# Patient Record
Sex: Male | Born: 1970 | Race: Black or African American | Hispanic: No | Marital: Single | Smoking: Never smoker
Health system: Southern US, Community
[De-identification: ages and names within clinical notes are randomized; demographics above are authoritative.]

## PROBLEM LIST (undated history)

## (undated) DIAGNOSIS — M5431 Sciatica, right side: Secondary | ICD-10-CM

## (undated) DIAGNOSIS — M5430 Sciatica, unspecified side: Secondary | ICD-10-CM

---

## 1999-12-07 ENCOUNTER — Encounter: Admission: RE | Admit: 1999-12-07 | Discharge: 1999-12-07 | Payer: Self-pay | Admitting: Family Medicine

## 1999-12-07 ENCOUNTER — Encounter: Payer: Self-pay | Admitting: Family Medicine

## 2009-01-27 ENCOUNTER — Encounter: Admission: RE | Admit: 2009-01-27 | Discharge: 2009-01-27 | Payer: Self-pay | Admitting: Family Medicine

## 2009-03-10 ENCOUNTER — Encounter: Admission: RE | Admit: 2009-03-10 | Discharge: 2009-03-10 | Payer: Self-pay | Admitting: Orthopaedic Surgery

## 2010-03-07 IMAGING — CR DG LUMBAR SPINE COMPLETE 4+V
5 series · 5 of 5 positions shown · non-contrast
Comparison: None

CLINICAL DATA: Low back and right leg pain.  No specific trauma.

LUMBAR SPINE - COMPLETE 4+ VIEW

[t l-spine a.p.]
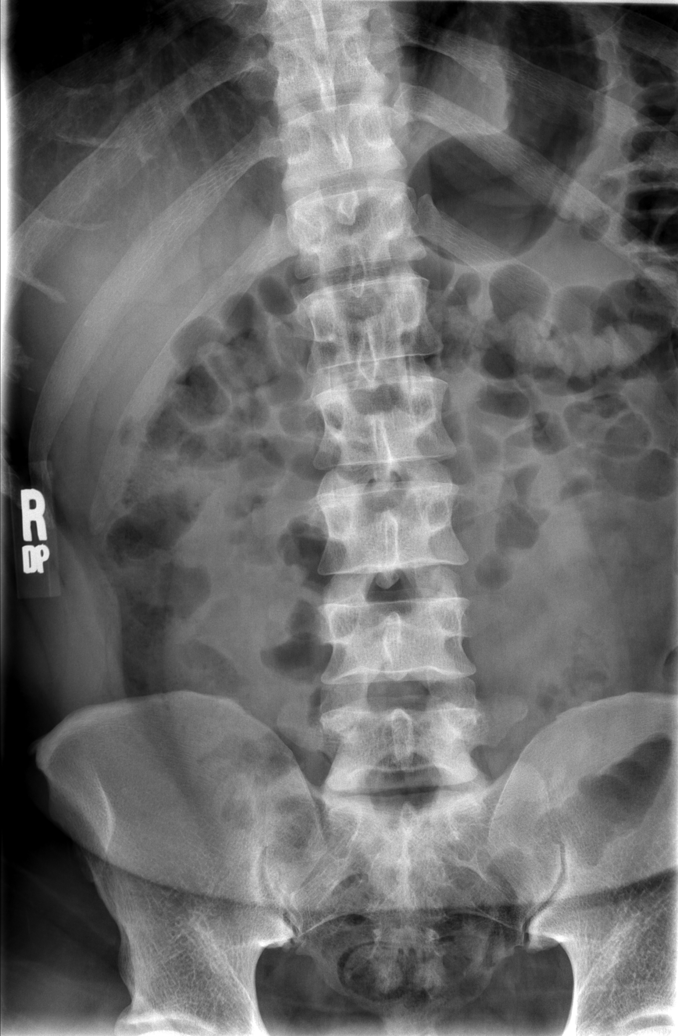

[t l-spine oblique exposure (1 of 2)]
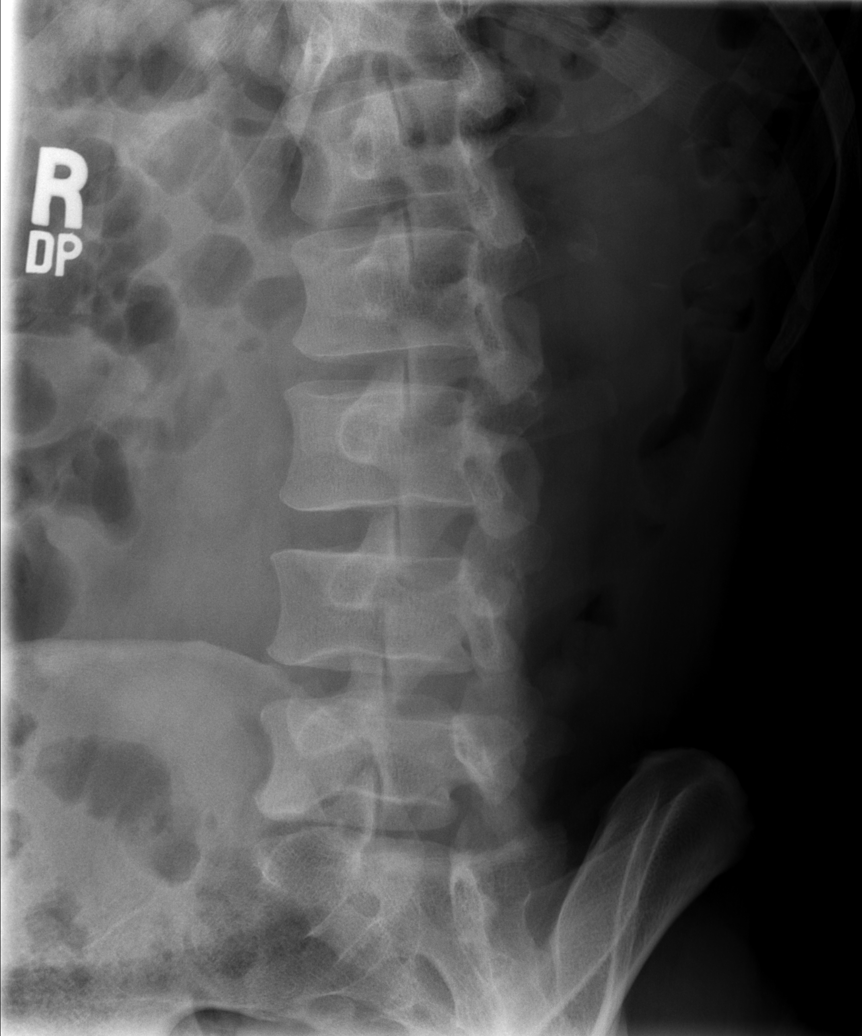

[t l-spine oblique exposure (2 of 2)]
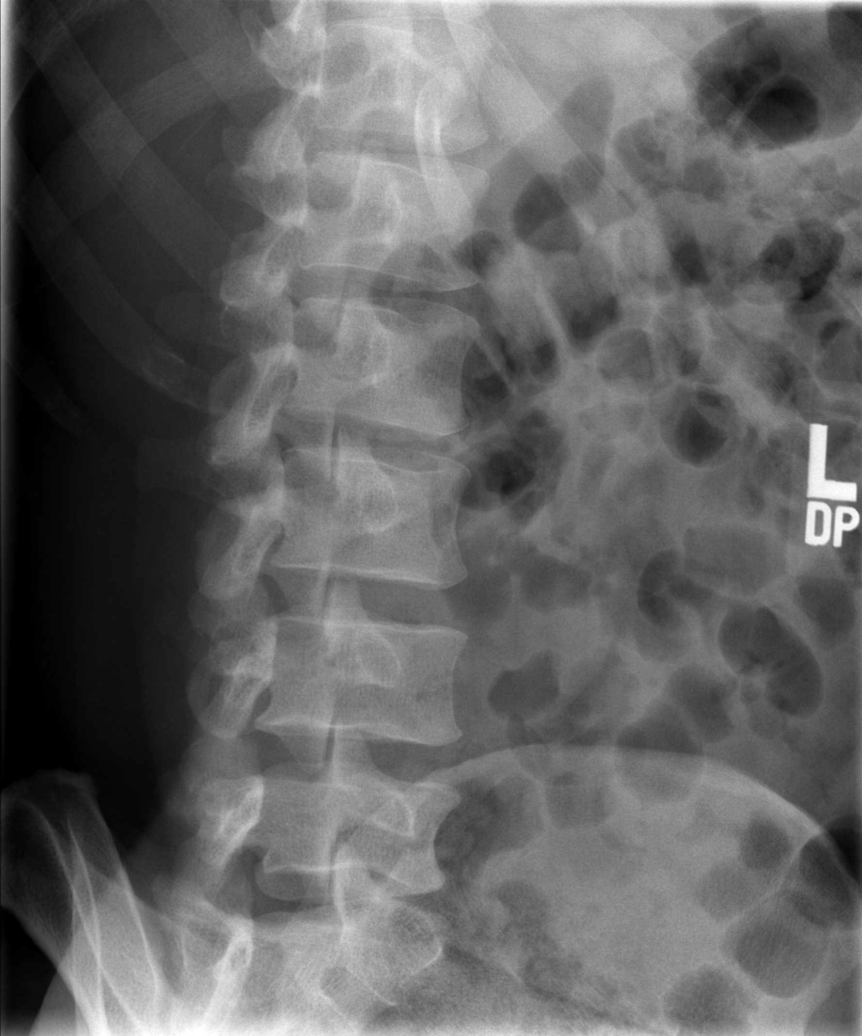

[t l-spine lat]
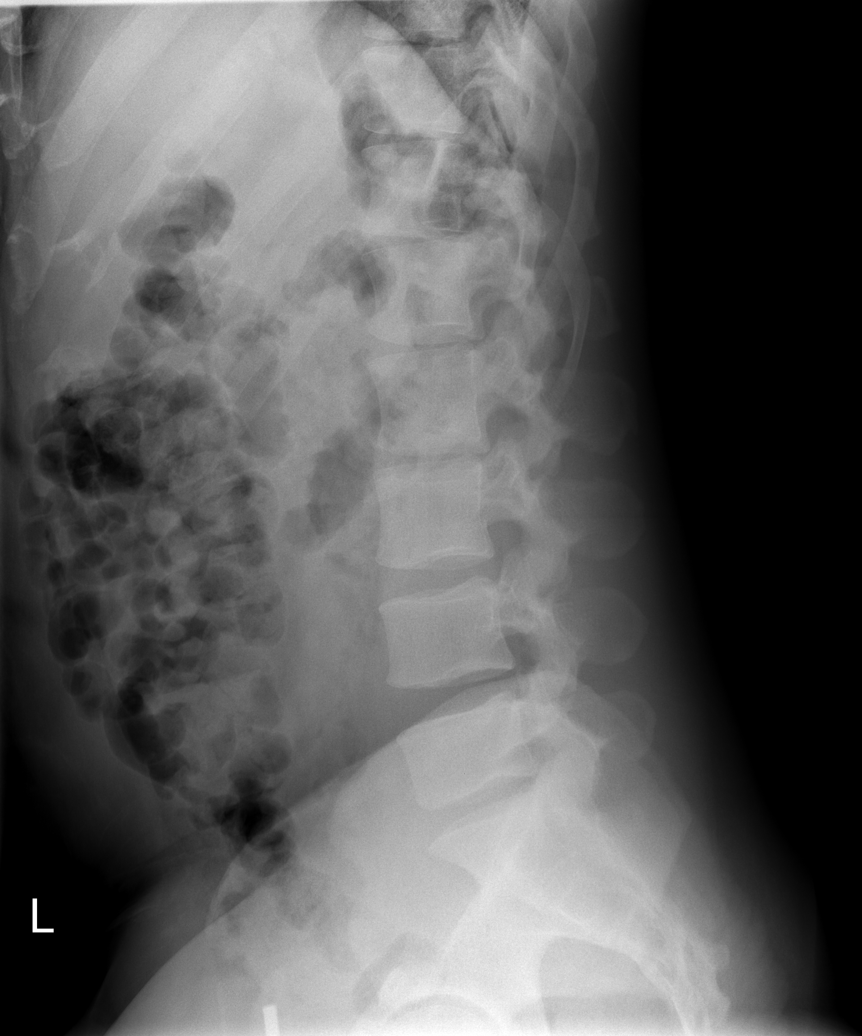

[t l-spine l5-s1 spot]
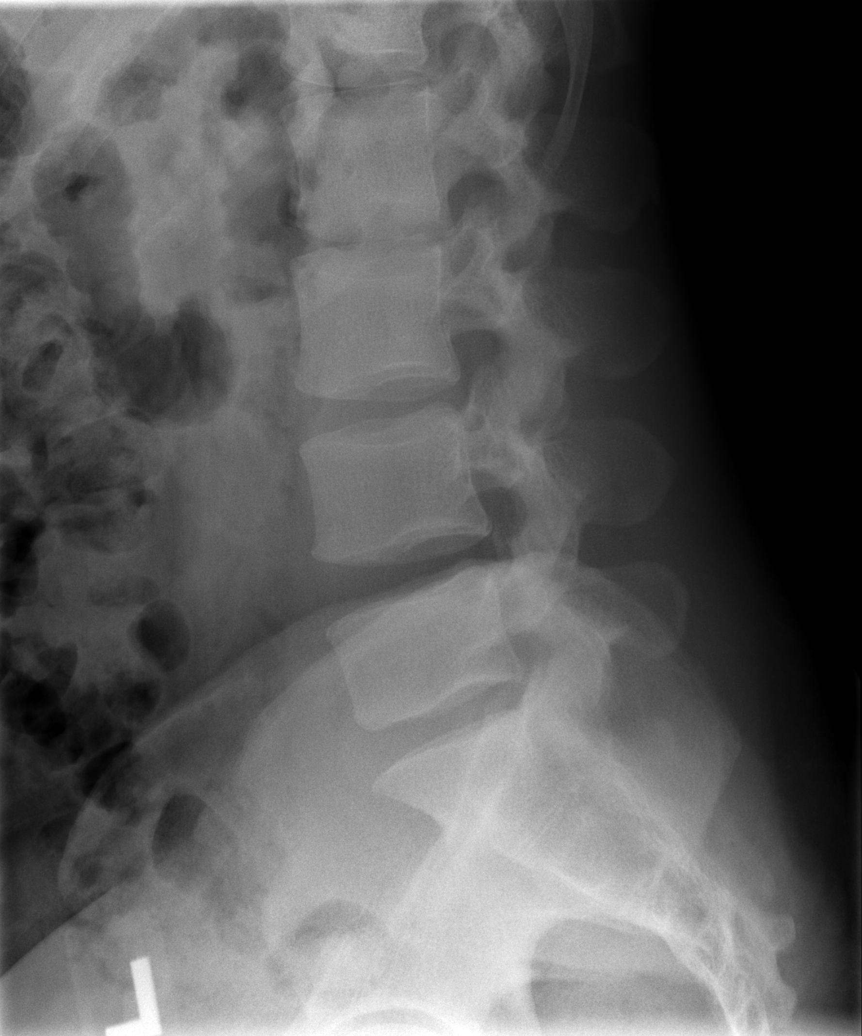

[5 of 5 positions shown; findings below may reference images not displayed]

FINDINGS: Five non-rib bearing lumbar vertebrae are seen.  L5-S1
disc space is smaller which may represent anatomic variation and/or
slight degenerative disc disease.  Remaining lumbar disc spaces and
vertebral alignment normally maintained.  No other significant
osseous, articular or soft tissue abnormality noted.
IMPRESSION: 1. Smaller L5-S1 disc consistent with anatomic variant and/or
slight degenerative disc disease.
2.  Otherwise, negative.

## 2014-10-22 ENCOUNTER — Ambulatory Visit (HOSPITAL_COMMUNITY)
Admission: RE | Admit: 2014-10-22 | Discharge: 2014-10-22 | Disposition: A | Discharge status: Home / Self Care | Payer: BC Managed Care – PPO | Source: Ambulatory Visit | Attending: Family | Admitting: Family

## 2014-10-22 DIAGNOSIS — Z31448 Encounter for other genetic testing of male for procreative management: Secondary | ICD-10-CM | POA: Diagnosis not present

## 2014-10-22 LAB — CBC
HCT: 40.1 % (ref 39.0–52.0)
Hemoglobin: 13.5 g/dL (ref 13.0–17.0)
MCH: 29.5 pg (ref 26.0–34.0)
MCHC: 33.7 g/dL (ref 30.0–36.0)
MCV: 87.6 fL (ref 78.0–100.0)
Platelets: 292 10*3/uL (ref 150–400)
RBC: 4.58 MIL/uL (ref 4.22–5.81)
RDW: 14.4 % (ref 11.5–15.5)
WBC: 11.8 10*3/uL — ABNORMAL HIGH (ref 4.0–10.5)

## 2014-10-24 LAB — HEMOGLOBINOPATHY EVALUATION
Hemoglobin Other: 0 %
Hgb A2 Quant: 2.5 % (ref 2.2–3.2)
Hgb A: 97.5 % (ref 96.8–97.8)
Hgb F Quant: 0 % (ref 0.0–2.0)
Hgb S Quant: 0 %

## 2015-06-25 HISTORY — PX: VASECTOMY: SHX75

## 2015-06-27 ENCOUNTER — Observation Stay (HOSPITAL_BASED_OUTPATIENT_CLINIC_OR_DEPARTMENT_OTHER)
Admission: EM | Admit: 2015-06-27 | Discharge: 2015-06-28 | Disposition: A | Discharge status: Other Acute Inpatient Hospital | Payer: BLUE CROSS/BLUE SHIELD | Attending: Emergency Medicine | Admitting: Emergency Medicine

## 2015-06-27 ENCOUNTER — Emergency Department (HOSPITAL_BASED_OUTPATIENT_CLINIC_OR_DEPARTMENT_OTHER): Payer: BLUE CROSS/BLUE SHIELD

## 2015-06-27 ENCOUNTER — Encounter (HOSPITAL_BASED_OUTPATIENT_CLINIC_OR_DEPARTMENT_OTHER): Payer: Self-pay | Admitting: *Deleted

## 2015-06-27 DIAGNOSIS — I959 Hypotension, unspecified: Secondary | ICD-10-CM | POA: Diagnosis present

## 2015-06-27 DIAGNOSIS — T63441A Toxic effect of venom of bees, accidental (unintentional), initial encounter: Secondary | ICD-10-CM | POA: Diagnosis not present

## 2015-06-27 DIAGNOSIS — R000 Tachycardia, unspecified: Secondary | ICD-10-CM | POA: Diagnosis not present

## 2015-06-27 DIAGNOSIS — T63461A Toxic effect of venom of wasps, accidental (unintentional), initial encounter: Secondary | ICD-10-CM | POA: Diagnosis present

## 2015-06-27 DIAGNOSIS — T7840XA Allergy, unspecified, initial encounter: Secondary | ICD-10-CM | POA: Diagnosis not present

## 2015-06-27 DIAGNOSIS — T782XXA Anaphylactic shock, unspecified, initial encounter: Secondary | ICD-10-CM

## 2015-06-27 HISTORY — DX: Sciatica, unspecified side: M54.30

## 2015-06-27 HISTORY — DX: Sciatica, right side: M54.31

## 2015-06-27 LAB — COMPREHENSIVE METABOLIC PANEL
ALBUMIN: 3.8 g/dL (ref 3.5–5.0)
ALT: 27 U/L (ref 17–63)
ANION GAP: 13 (ref 5–15)
AST: 31 U/L (ref 15–41)
Alkaline Phosphatase: 63 U/L (ref 38–126)
BUN: 17 mg/dL (ref 6–20)
CHLORIDE: 101 mmol/L (ref 101–111)
CO2: 25 mmol/L (ref 22–32)
Calcium: 9.5 mg/dL (ref 8.9–10.3)
Creatinine, Ser: 1.48 mg/dL — ABNORMAL HIGH (ref 0.61–1.24)
GFR calc non Af Amer: 56 mL/min — ABNORMAL LOW (ref >60)
Glucose, Bld: 134 mg/dL — ABNORMAL HIGH (ref 65–99)
POTASSIUM: 4.1 mmol/L (ref 3.5–5.1)
SODIUM: 139 mmol/L (ref 135–145)
TOTAL PROTEIN: 7.9 g/dL (ref 6.5–8.1)
Total Bilirubin: 0.3 mg/dL (ref 0.3–1.2)

## 2015-06-27 LAB — CBC WITH DIFFERENTIAL/PLATELET
BASOS PCT: 0 % (ref 0–1)
Basophils Absolute: 0 10*3/uL (ref 0.0–0.1)
EOS ABS: 0 10*3/uL (ref 0.0–0.7)
Eosinophils Relative: 0 % (ref 0–5)
HEMATOCRIT: 45.3 % (ref 39.0–52.0)
Hemoglobin: 15.3 g/dL (ref 13.0–17.0)
LYMPHS PCT: 44 % (ref 12–46)
Lymphs Abs: 4 10*3/uL (ref 0.7–4.0)
MCH: 29 pg (ref 26.0–34.0)
MCHC: 33.8 g/dL (ref 30.0–36.0)
MCV: 85.8 fL (ref 78.0–100.0)
MONO ABS: 1.1 10*3/uL — AB (ref 0.1–1.0)
Monocytes Relative: 12 % (ref 3–12)
Neutro Abs: 4 10*3/uL (ref 1.7–7.7)
Neutrophils Relative %: 44 % (ref 43–77)
Platelets: 364 10*3/uL (ref 150–400)
RBC: 5.28 MIL/uL (ref 4.22–5.81)
RDW: 14.5 % (ref 11.5–15.5)
WBC: 9.2 10*3/uL (ref 4.0–10.5)

## 2015-06-27 LAB — LIPASE, BLOOD: LIPASE: 21 U/L — AB (ref 22–51)

## 2015-06-27 LAB — TROPONIN I

## 2015-06-27 MED ORDER — ONDANSETRON HCL 4 MG/2ML IJ SOLN: 4.0000 mg | Freq: Four times a day (QID) | INTRAMUSCULAR | Status: DC | PRN

## 2015-06-27 MED ORDER — FAMOTIDINE IN NACL 20-0.9 MG/50ML-% IV SOLN
20.0000 mg | Freq: Once | INTRAVENOUS | Status: AC
  Administered 2015-06-27: 20 mg via INTRAVENOUS
  Filled 2015-06-27: qty 50

## 2015-06-27 MED ORDER — ALBUTEROL SULFATE (2.5 MG/3ML) 0.083% IN NEBU: 2.5000 mg | INHALATION_SOLUTION | RESPIRATORY_TRACT | Status: DC | PRN

## 2015-06-27 MED ORDER — DIPHENHYDRAMINE HCL 25 MG PO CAPS
25.0000 mg | ORAL_CAPSULE | Freq: Four times a day (QID) | ORAL | Status: DC | PRN
  Administered 2015-06-28: 25 mg via ORAL
  Filled 2015-06-27: qty 1

## 2015-06-27 MED ORDER — MORPHINE SULFATE 2 MG/ML IJ SOLN: 1.0000 mg | INTRAMUSCULAR | Status: DC | PRN

## 2015-06-27 MED ORDER — ACETAMINOPHEN 650 MG RE SUPP: 650.0000 mg | Freq: Four times a day (QID) | RECTAL | Status: DC | PRN

## 2015-06-27 MED ORDER — METHYLPREDNISOLONE SODIUM SUCC 125 MG IJ SOLR
125.0000 mg | Freq: Once | INTRAMUSCULAR | Status: AC
  Administered 2015-06-27: 125 mg via INTRAVENOUS
  Filled 2015-06-27: qty 2

## 2015-06-27 MED ORDER — SODIUM CHLORIDE 0.9 % IJ SOLN
3.0000 mL | Freq: Two times a day (BID) | INTRAMUSCULAR | Status: DC
  Administered 2015-06-28: 3 mL via INTRAVENOUS

## 2015-06-27 MED ORDER — ONDANSETRON HCL 4 MG/2ML IJ SOLN
4.0000 mg | Freq: Once | INTRAMUSCULAR | Status: AC
  Administered 2015-06-27: 4 mg via INTRAVENOUS

## 2015-06-27 MED ORDER — EPINEPHRINE 0.3 MG/0.3ML IJ SOAJ: 0.3000 mg | Freq: Once | INTRAMUSCULAR | Status: DC

## 2015-06-27 MED ORDER — OXYCODONE HCL 5 MG PO TABS: 5.0000 mg | ORAL_TABLET | ORAL | Status: DC | PRN

## 2015-06-27 MED ORDER — SODIUM CHLORIDE 0.9 % IV SOLN: INTRAVENOUS | Status: DC

## 2015-06-27 MED ORDER — DICYCLOMINE HCL 10 MG PO CAPS
10.0000 mg | ORAL_CAPSULE | Freq: Three times a day (TID) | ORAL | Status: DC | PRN
  Administered 2015-06-28: 10 mg via ORAL
  Filled 2015-06-27: qty 1

## 2015-06-27 MED ORDER — SODIUM CHLORIDE 0.9 % IV BOLUS (SEPSIS)
2000.0000 mL | Freq: Once | INTRAVENOUS | Status: AC
  Administered 2015-06-27: 2000 mL via INTRAVENOUS

## 2015-06-27 MED ORDER — SODIUM CHLORIDE 0.9 % IV SOLN
INTRAVENOUS | Status: DC
Start: 2015-06-27 — End: 2015-06-27

## 2015-06-27 MED ORDER — ACETAMINOPHEN 325 MG PO TABS: 650.0000 mg | ORAL_TABLET | Freq: Four times a day (QID) | ORAL | Status: DC | PRN

## 2015-06-27 MED ORDER — METHYLPREDNISOLONE SODIUM SUCC 125 MG IJ SOLR
80.0000 mg | Freq: Once | INTRAMUSCULAR | Status: AC
  Administered 2015-06-27: 80 mg via INTRAVENOUS
  Filled 2015-06-27: qty 2

## 2015-06-27 MED ORDER — FAMOTIDINE IN NACL 20-0.9 MG/50ML-% IV SOLN
20.0000 mg | Freq: Two times a day (BID) | INTRAVENOUS | Status: DC
  Administered 2015-06-27 – 2015-06-28 (×2): 20 mg via INTRAVENOUS
  Filled 2015-06-27 (×3): qty 50

## 2015-06-27 MED ORDER — SODIUM CHLORIDE 0.9 % IV BOLUS (SEPSIS)
1000.0000 mL | Freq: Once | INTRAVENOUS | Status: AC
  Administered 2015-06-27: 1000 mL via INTRAVENOUS

## 2015-06-27 MED ORDER — ONDANSETRON HCL 4 MG/2ML IJ SOLN
INTRAMUSCULAR | Status: AC
  Filled 2015-06-27: qty 2

## 2015-06-27 MED ORDER — SODIUM CHLORIDE 0.9 % IV SOLN
INTRAVENOUS | Status: DC
  Administered 2015-06-27: 100 mL/h via INTRAVENOUS

## 2015-06-27 MED ORDER — ENOXAPARIN SODIUM 60 MG/0.6ML ~~LOC~~ SOLN
50.0000 mg | SUBCUTANEOUS | Status: DC
  Administered 2015-06-27: 50 mg via SUBCUTANEOUS
  Filled 2015-06-27 (×2): qty 0.6

## 2015-06-27 MED ORDER — METHYLPREDNISOLONE SODIUM SUCC 125 MG IJ SOLR
60.0000 mg | Freq: Two times a day (BID) | INTRAMUSCULAR | Status: DC
  Administered 2015-06-28: 60 mg via INTRAVENOUS
  Filled 2015-06-27: qty 2
  Filled 2015-06-27 (×2): qty 0.96

## 2015-06-27 MED ORDER — ONDANSETRON HCL 4 MG PO TABS: 4.0000 mg | ORAL_TABLET | Freq: Four times a day (QID) | ORAL | Status: DC | PRN

## 2015-06-27 NOTE — Progress Notes (Addendum)
ANTICOAGULATION CONSULT NOTE - Initial Consult  Pharmacy Consult for Lovenox -pharmacy may adjust for  Indication: VTE prophylaxis  Allergies  Allergen Reactions  . Bee Venom Anaphylaxis    Patient Measurements: Height: 6\' 3"  (190.5 cm) Weight: 257 lb 11.5 oz (116.9 kg) IBW/kg (Calculated) : 84.5   Vital Signs: Temp: 99.6 F (37.6 C) (07/16 1721) Temp Source: Oral (07/16 1721) BP: 106/75 mmHg (07/16 1721) Pulse Rate: 117 (07/16 1721)  Labs:  Recent Labs  06/27/15 1155  HGB 15.3  HCT 45.3  PLT 364  CREATININE 1.48*  TROPONINI <0.03    Estimated Creatinine Clearance: 87.8 mL/min (by C-G formula based on Cr of 1.48).   Medical History: Past Medical History  Diagnosis Date  . Sciatica of right side   . Sciatic pain     Medications:  Prescriptions prior to admission  Medication Sig Dispense Refill Last Dose  . Multiple Vitamin (MULTIVITAMIN) tablet Take 1 tablet by mouth daily.   06/24/2015  . valACYclovir (VALTREX) 1000 MG tablet Take 1 g by mouth daily as needed (for outbreaks).    Past Week  . cephALEXin (KEFLEX) 500 MG capsule Take 500 mg by mouth 3 (three) times daily.    Never  . HYDROcodone-acetaminophen (NORCO/VICODIN) 5-325 MG per tablet Take 1-2 tablets by mouth every 4 (four) hours as needed for moderate pain.    Never  . naproxen (NAPROSYN) 250 MG tablet Take 250 mg by mouth 2 (two) times daily with a meal.   Never   Scheduled:  . enoxaparin (LOVENOX) injection  40 mg Subcutaneous Q24H  . famotidine (PEPCID) IV  20 mg Intravenous Q12H  . [START ON 06/28/2015] methylPREDNISolone (SOLU-MEDROL) injection  60 mg Intravenous Q12H  . methylPREDNISolone (SOLU-MEDROL) injection  80 mg Intravenous Once  . sodium chloride  3 mL Intravenous Q12H    Assessment: 44 y.o male history who was stung by yellow jackets earlier today and had a severe allergic reaction to the same. MD notes that patient is currently stable.  Order received to start on SQ lovenox for  VTE prophylaxis. Pharmacy may adjust lovenox for BMI >30.  Weight = 257lbs 11oz,  116.9 kg, ht 726ft 3 in,  BMI 32.3   SCr  1.48,  CrCl 87 ml/min, /H = 15.3/45.3, pltc 364K . No bleeding noted.   Goal of Therapy:  Heparin level 0.3-0.6 units/ml (4h after lovenox dose) Monitor platelets by anticoagulation protocol: Yes   Plan:  Adjust Lovenox dose to 50 mg SQ q24h for VTE prophylaxis for patient with BMI >30   Stephen Greene, RPh Clinical Pharmacist Pager: [phone] 06/27/2015,6:29 PM

## 2015-06-27 NOTE — Progress Notes (Signed)
[facility] on observation Mr. 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 Patient received solumedrol, IVF's and Pepcid in the ED. Patient took epipen and benadryl at home. Patient has continued been slightly hypotensive (SBP mid 90's) and with sinus tachycardia. [facility] requested for admission on observation due to allergic reaction.  No fever, no nausea, no vomiting. Positive cramping Abd pain and some loose stools (no blood on it). Has received a total of 4 L fluids. Accepted on observation to telemetry.  Stephen Greene, Stephen Greene [phone]

## 2015-06-27 NOTE — ED Notes (Signed)
Bedside report given to RN staff with HP-1 Transport Team

## 2015-06-27 NOTE — H&P (Signed)
[facility] History and Physical  Stephen Greene MRN:[id number] DOB: 05/09/1971 DOA: 06/27/2015   PCP: Dean Mitchell, MD  Specialists: Seen recently by a urologist in [place] for vasectomy  Chief Complaint: Stung by a yellow jacket  HPI: Stephen Greene is a 43 y.o. male with the past medical history of sciatica who underwent a vasectomy this past Thursday, which was uneventful. He was working in his lawn when he was stung by yellow jackets in the left ankle area. About 20-30 minutes after this episode, he started having itching. He felt bumps on his skin. He took Benadryl. Symptoms did not improve. He got short of breath. His tongue started swelling. He felt dizzy, lightheaded but did not pass out. Then he used his son's EpiPen injection. He also had some cramping pain in his abdomen. Then he went to the emergency department. Over there he was given fluids and steroids. Then subsequently started having nausea, vomiting and worsening stomach cramps. His blood pressure dropped. He became very tachycardic. Subsequently started improving, but remained tachycardic. So he was brought into the hospital for observation.  Home Medications: He is not taking any of the following medicines at this time. Prior to Admission medications   Medication Sig Start Date End Date Taking? Authorizing Provider  cephALEXin (KEFLEX) 500 MG capsule Take by mouth. 06/25/15   Historical Provider, MD  HYDROcodone-acetaminophen (NORCO/VICODIN) 5-325 MG per tablet Take 1-2 tablets by mouth every 4 (four) hours as needed for moderate pain.  06/25/15   Historical Provider, MD  HYDROcodone-acetaminophen (VICODIN) 5-500 MG per tablet Take 1 tablet by mouth every 6 (six) hours as needed for pain.     Historical Provider, MD  naproxen (NAPROSYN) 250 MG tablet Take 250 mg by mouth 2 (two) times daily with a meal.    Historical Provider, MD  valACYclovir (VALTREX) 1000 MG tablet Take 1 g by mouth daily. 05/20/15   Historical  Provider, MD    Allergies:  Allergies  Allergen Reactions  . Bee Venom Anaphylaxis    Past Medical History: Past Medical History  Diagnosis Date  . Sciatica of right side   . Sciatic pain     Past Surgical History  Procedure Laterality Date  . Vasectomy  06/25/2015    Social History: He lives in [place]. He lives with his son and fianc. He works for [employer]. No smoking use. Occasional alcohol use. No illicit drug use. Usually independent with daily activities.  Family History:  Family History  Problem Relation Age of Onset  . Heart attack Mother   . Allergy (severe) Son   One of his uncles died as a result of a bee sting. Son also has an allergy to bees and has an EpiPen as a result.  Review of Systems - History obtained from the patient General ROS: positive for  - fatigue Psychological ROS: negative Ophthalmic ROS: negative ENT ROS: negative Allergy and Immunology ROS: as in hpi Hematological and Lymphatic ROS: negative Endocrine ROS: negative Respiratory ROS: no cough, shortness of breath, or wheezing Cardiovascular ROS: no chest pain or dyspnea on exertion Gastrointestinal ROS: as in hpi Genito-Urinary ROS: no dysuria, trouble voiding, or hematuria Musculoskeletal ROS: negative Neurological ROS: no TIA or stroke symptoms Dermatological ROS: as in hpi  Physical Examination  Filed Vitals:   06/27/15 1515 06/27/15 1530 06/27/15 1551 06/27/15 1721  BP: 102/79 108/61 94/62 106/75  Pulse: 118 117 118 117  Temp:    99.6 F (37.6 C)  TempSrc:      Oral  Resp: 16 15  22  Height:    6' 3" (1.905 m)  Weight:    116.9 kg (257 lb 11.5 oz)  SpO2: 94% 95% 97% 98%    BP 106/75 mmHg  Pulse 117  Temp(Src) 99.6 F (37.6 C) (Oral)  Resp 22  Ht 6' 3" (1.905 m)  Wt 116.9 kg (257 lb 11.5 oz)  BMI 32.21 kg/m2  SpO2 98%  General appearance: alert, cooperative, appears stated age and no distress Head: Normocephalic, without obvious abnormality, atraumatic Eyes:  conjunctivae/corneas clear. PERRL, EOM's intact. Throat: lips, mucosa teeth and gums normal. No obvious tongue swelling identified. Able to visualize his throat easily. Neck: no adenopathy, no carotid bruit, no JVD, supple, symmetrical, trachea midline and thyroid not enlarged, symmetric, no tenderness/mass/nodules Resp: clear to auscultation bilaterally. No wheezing Cardio: Son, S2 is tachycardic. Regular. No S3, S4. No rubs, murmurs, or bruit. No pedal edema.  GI: soft, non-tender; bowel sounds normal; no masses,  no organomegaly. Genital examination does not reveal any swelling. No drainage or discharge. Testes appear to be normal size. Extremities: extremities normal, atraumatic, no cyanosis or edema. No area of inflammation or swelling noted at the site of the yellow jacket stings in the left ankle area. Pulses: 2+ and symmetric Skin: Skin color, texture, turgor normal. No rashes or lesions Lymph nodes: Cervical, supraclavicular, and axillary nodes normal. Neurologic: No focal deficits.  Laboratory Data: Results for orders placed or performed during the hospital encounter of 06/27/15 (from the past 48 hour(s))  CBC with Differential/Platelet     Status: Abnormal   Collection Time: 06/27/15 11:55 AM  Result Value Ref Range   WBC 9.2 4.0 - 10.5 K/uL   RBC 5.28 4.22 - 5.81 MIL/uL   Hemoglobin 15.3 13.0 - 17.0 g/dL   HCT 45.3 39.0 - 52.0 %   MCV 85.8 78.0 - 100.0 fL   MCH 29.0 26.0 - 34.0 pg   MCHC 33.8 30.0 - 36.0 g/dL   RDW 14.5 11.5 - 15.5 %   Platelets 364 150 - 400 K/uL   Neutrophils Relative % 44 43 - 77 %   Neutro Abs 4.0 1.7 - 7.7 K/uL   Lymphocytes Relative 44 12 - 46 %   Lymphs Abs 4.0 0.7 - 4.0 K/uL   Monocytes Relative 12 3 - 12 %   Monocytes Absolute 1.1 (H) 0.1 - 1.0 K/uL   Eosinophils Relative 0 0 - 5 %   Eosinophils Absolute 0.0 0.0 - 0.7 K/uL   Basophils Relative 0 0 - 1 %   Basophils Absolute 0.0 0.0 - 0.1 K/uL  Lipase, blood     Status: Abnormal   Collection  Time: 06/27/15 11:55 AM  Result Value Ref Range   Lipase 21 (L) 22 - 51 U/L  Comprehensive metabolic panel     Status: Abnormal   Collection Time: 06/27/15 11:55 AM  Result Value Ref Range   Sodium 139 135 - 145 mmol/L   Potassium 4.1 3.5 - 5.1 mmol/L   Chloride 101 101 - 111 mmol/L   CO2 25 22 - 32 mmol/L   Glucose, Bld 134 (H) 65 - 99 mg/dL   BUN 17 6 - 20 mg/dL   Creatinine, Ser 1.48 (H) 0.61 - 1.24 mg/dL   Calcium 9.5 8.9 - 10.3 mg/dL   Total Protein 7.9 6.5 - 8.1 g/dL   Albumin 3.8 3.5 - 5.0 g/dL   AST 31 15 - 41 U/L   ALT 27 17 - 63   U/L   Alkaline Phosphatase 63 38 - 126 U/L   Total Bilirubin 0.3 0.3 - 1.2 mg/dL   GFR calc non Af Amer 56 (L) >60 mL/min   GFR calc Af Amer >60 >60 mL/min    Comment: (NOTE) The eGFR has been calculated using the CKD EPI equation. This calculation has not been validated in all clinical situations. eGFR's persistently <60 mL/min signify possible Chronic Kidney Disease.    Anion gap 13 5 - 15  Troponin I     Status: None   Collection Time: 06/27/15 11:55 AM  Result Value Ref Range   Troponin I <0.03 <0.031 ng/mL    Comment:        NO INDICATION OF MYOCARDIAL INJURY.     Radiology Reports: Dg Chest Port 1 View  06/27/2015   CLINICAL DATA:  Tongue swelling, stung by bee in left ankle  EXAM: PORTABLE CHEST - 1 VIEW  COMPARISON:  None.  FINDINGS: The heart size and mediastinal contours are within normal limits. Both lungs are clear. The visualized skeletal structures are unremarkable.  IMPRESSION: No active disease.   Electronically Signed   By: Lahoma Crocker M.D.   On: 06/27/2015 14:02    My interpretation of Electrocardiogram: Sinus tachycardia at 111 bpm. Normal axis. Intervals normal. No concerning ST or T-wave changes.  Problem List  Principal Problem:   Allergic reaction Active Problems:   Yellow jacket sting   Hypotension   Tachycardia   Assessment: This is a 45 year old African-American male with no significant past medical  history who was stung by yellow jackets earlier today and had a severe allergic reaction to the same. He is currently stable. Still a bit tachycardic. Blood pressure is improved.  Plan: #1 Severe Allergic reaction after yellow jacket sting: He was given steroids. We will give him an additional dose tonight. We'll order also doses for tomorrow, but depending on his clinical condition this may be discontinued if he shows improvement over the course of the night. Pepcid will be continued. Benadryl as needed. He will be given a prescription for EpiPen. He has been told that he would benefit from seeing an allergist to discuss venom immunotherapy. He will need to follow-up with his PCP for the same. Continue IV fluids. Monitor him on telemetry. Heart rate should improve. He does have some abdominal cramping. Bentyl can be utilized. Denies any difficulty swallowing currently. Tongue swelling appears to have improved significantly.  #2 recent vasectomy: No obvious complication of this procedure noted on examination.  #3 sciatica: Patient states that this has been acting up for the past couple of weeks. He should follow-up with his PCP for the same.   DVT Prophylaxis: Lovenox Code Status: Full code Family Communication: Discussed with the patient  Disposition Plan: Observe to telemetry   Further management decisions will depend on results of further testing and patient's response to treatment.   [facility]  [facility] Pager [phone]  If 7PM-7AM, please contact night-coverage [email or web address] Password [facility]  06/27/2015, 6:04 PM

## 2015-06-27 NOTE — ED Notes (Signed)
Pt cont on cardiac monitor, ST w/o ectopy, skin W&D, cap refill WNL

## 2015-06-27 NOTE — ED Notes (Signed)
[facility] called to notify of pt enroute to their facility, opportunity questions provided, provided latest update of pt status

## 2015-06-27 NOTE — ED Notes (Signed)
Cont to await for room assignment 

## 2015-06-27 NOTE — ED Notes (Signed)
Pt resting quietly, voices no complaints at this time, comfort measures provided.

## 2015-06-27 NOTE — ED Notes (Signed)
Pt reports being stung by bee in lt ankle.No SOB at time, tongue started swelling .  Pt reports taking son's epi pen and 50 mg PO Benadryl at 1130.

## 2015-06-27 NOTE — ED Provider Notes (Addendum)
CSN: [id number]     Arrival date & time 06/27/15  1140 History   First MD Initiated Contact with Patient 06/27/15 1157     Chief Complaint  Patient presents with  . Insect Bite  . Allergic Reaction     (Consider location/radiation/quality/duration/timing/severity/associated sxs/prior Treatment) Patient is a 44 y.o. male presenting with allergic reaction. The history is provided by the patient and the spouse.  Allergic Reaction Presenting symptoms: rash   Presenting symptoms: no difficulty swallowing    patient status post bee sting to left ankle area this morning while mowing the grass occurred at approximately 11:00 in the morning. Patient known to have an allergy to bee stings. Initially felt okay but then started to get some swelling to the tongue and hives broke out. Patient took Benadryl 50 mg by mouth at home at around 11:30 and also used his son's epi-pen which should be a Print production plannerjunior pen. Patient brought in here. No trouble breathing no trouble swallowing   History reviewed. No pertinent past medical history. Past Surgical History  Procedure Laterality Date  . Vesicostomy  06/26/15   History reviewed. No pertinent family history. History  Substance Use Topics  . Smoking status: Never Smoker   . Smokeless tobacco: Not on file  . Alcohol Use: 0.6 oz/week    1 Cans of beer per week    Review of Systems  Constitutional: Negative for fever.  HENT: Positive for facial swelling. Negative for congestion and trouble swallowing.   Eyes: Negative for redness.  Respiratory: Negative for shortness of breath.   Cardiovascular: Negative for chest pain.  Gastrointestinal: Positive for abdominal pain. Negative for nausea and vomiting.  Genitourinary: Negative for dysuria.  Musculoskeletal: Negative for back pain.  Skin: Positive for rash.  Neurological: Negative for headaches.  Hematological: Does not bruise/bleed easily.  Psychiatric/Behavioral: Negative for confusion.       Allergies  Bee venom  Home Medications   Prior to Admission medications   Not on File   BP 104/74 mmHg  Pulse 122  Temp(Src) 98.8 F (37.1 C) (Oral)  Resp 16  Ht 6\' 3"  (1.905 m)  Wt 240 lb (108.863 kg)  BMI 30.00 kg/m2  SpO2 95% Physical Exam  Constitutional: He is oriented to person, place, and time. He appears well-developed and well-nourished. No distress.  HENT:  Head: Normocephalic and atraumatic.  Mouth/Throat: Oropharynx is clear and moist.  No lip swelling but swelling to the tongue, mild  Eyes: Conjunctivae and EOM are normal. Pupils are equal, round, and reactive to light.  Neck: Normal range of motion.  Cardiovascular: Regular rhythm.   Tachycardic  Pulmonary/Chest: Effort normal and breath sounds normal. He has no wheezes.  Abdominal: Soft. Bowel sounds are normal. There is no tenderness.  Musculoskeletal: He exhibits edema.  Neurological: He is alert and oriented to person, place, and time. No cranial nerve deficit. He exhibits normal muscle tone. Coordination normal.  Skin: Rash noted. There is erythema.  Hives on the upper arms face and back.  Nursing note and vitals reviewed.   ED Course  Procedures (including critical care time) Labs Review Labs Reviewed  CBC WITH DIFFERENTIAL/PLATELET  LIPASE, BLOOD  COMPREHENSIVE METABOLIC PANEL  TROPONIN I    Imaging Review Dg Chest Port 1 View  06/27/2015   CLINICAL DATA:  Tongue swelling, stung by bee in left ankle  EXAM: PORTABLE CHEST - 1 VIEW  COMPARISON:  None.  FINDINGS: The heart size and mediastinal contours are within normal limits. Both lungs  are clear. The visualized skeletal structures are unremarkable.  IMPRESSION: No active disease.   Electronically Signed   By: Natasha Mead M.D.   On: 06/27/2015 14:02     EKG Interpretation   Date/Time:  Saturday June 27 2015 13:59:07 EDT Ventricular Rate:  111 PR Interval:  152 QRS Duration: 76 QT Interval:  326 QTC Calculation: 443 R Axis:    22 Text Interpretation:  Sinus tachycardia Cannot rule out Inferior infarct ,  age undetermined Abnormal ECG No previous ECGs available Confirmed by  Adiya Selmer  MD, Youcef Klas [phone]) on 06/27/2015 2:02:29 PM      CRITICAL CARE Performed by: Vanetta Mulders Total critical care time: 30 Critical care time was exclusive of separately billable procedures and treating other patients. Critical care was necessary to treat or prevent imminent or life-threatening deterioration. Critical care was time spent personally by me on the following activities: development of treatment plan with patient and/or surrogate as well as nursing, discussions with consultants, evaluation of patient's response to treatment, examination of patient, obtaining history from patient or surrogate, ordering and performing treatments and interventions, ordering and review of laboratory studies, ordering and review of radiographic studies, pulse oximetry and re-evaluation of patient's condition.     MDM   Final diagnoses:  Allergic reaction  Hypotension, unspecified hypotension type  Anaphylactic reaction, initial encounter    Patient with known allergy to bee stings. Patient was stung by a bee approximately 11:00 in the morning. Did take some Benadryl at home 50 mg. And also used his son's of Careers adviser. Patient did develop hives some abdominal cramping and tongue swelling no lip swelling no wheezing. Tongue swelling improved significantly here with treatment.  Upon arrival here patient was given Pepcid and Solu-Medrol IV as well as a liter of IV fluids. Patient did have some mild tachycardia but it was felt to be secondary to the epinephrine.  Patient was observed. Patient was never given any pain medicine. Patient's had the a few loose bowel movements. No blood. Patient then had a hypotensive episode and tachycardia heart rate went up to about 140 was sinus and blood pressure dropped down to 59 systolic. Patient was  immediately given 2 more liters of fluid heart rate came down to about the 110 and blood pressure increased to over 100 systolic. However based on these events patient will require admission and monitoring. Patient is followed by [facility] for a primary care provider.    Vanetta Mulders, MD 06/27/15 1453  Vanetta Mulders, MD 06/27/15 [phone]

## 2015-06-27 NOTE — Progress Notes (Signed)
Admitted to [phone] [id number] via stretcher via ambulance. Fiance with patient. Assessment as charted. IV intact,placed on pump. Admission history completed.

## 2015-06-27 NOTE — ED Notes (Signed)
Resp status WNL, tracheal sounds clear, speech clear, swallowing WNL

## 2015-06-28 DIAGNOSIS — T7840XA Allergy, unspecified, initial encounter: Secondary | ICD-10-CM

## 2015-06-28 LAB — CBC
HEMATOCRIT: 38.8 % — AB (ref 39.0–52.0)
Hemoglobin: 13.1 g/dL (ref 13.0–17.0)
MCH: 29 pg (ref 26.0–34.0)
MCHC: 33.8 g/dL (ref 30.0–36.0)
MCV: 85.8 fL (ref 78.0–100.0)
Platelets: 328 10*3/uL (ref 150–400)
RBC: 4.52 MIL/uL (ref 4.22–5.81)
RDW: 14.3 % (ref 11.5–15.5)
WBC: 16.6 10*3/uL — ABNORMAL HIGH (ref 4.0–10.5)

## 2015-06-28 LAB — BASIC METABOLIC PANEL
ANION GAP: 7 (ref 5–15)
BUN: 13 mg/dL (ref 6–20)
CALCIUM: 8.2 mg/dL — AB (ref 8.9–10.3)
CO2: 24 mmol/L (ref 22–32)
CREATININE: 1.18 mg/dL (ref 0.61–1.24)
Chloride: 104 mmol/L (ref 101–111)
GLUCOSE: 142 mg/dL — AB (ref 65–99)
POTASSIUM: 4.2 mmol/L (ref 3.5–5.1)
Sodium: 135 mmol/L (ref 135–145)

## 2015-06-28 MED ORDER — FAMOTIDINE 20 MG PO TABS: 20.0000 mg | ORAL_TABLET | Freq: Two times a day (BID) | ORAL | Status: DC

## 2015-06-28 MED ORDER — CEPHALEXIN 500 MG PO CAPS: 500.0000 mg | ORAL_CAPSULE | Freq: Three times a day (TID) | ORAL | Status: DC

## 2015-06-28 MED ORDER — EPINEPHRINE 0.3 MG/0.3ML IJ SOAJ: 0.3000 mg | Freq: Once | INTRAMUSCULAR | Status: DC

## 2015-06-28 MED ORDER — DIPHENHYDRAMINE HCL 25 MG PO CAPS: 25.0000 mg | ORAL_CAPSULE | Freq: Four times a day (QID) | ORAL | Status: DC | PRN

## 2015-06-28 MED ORDER — OXYCODONE HCL 5 MG PO TABS: 5.0000 mg | ORAL_TABLET | ORAL | Status: AC | PRN

## 2015-06-28 NOTE — Discharge Summary (Signed)
Physician Discharge Summary  Stephen Greene ZOX:[id number] DOB: 01/12/1971 DOA: 06/27/2015  PCP: Lupe Carney, MD  Admit date: 06/27/2015 Discharge date: 06/28/2015  Recommendations for Outpatient Follow-up:  1. Pt will need to follow up with PCP in 2-3 weeks post discharge 2. Please obtain BMP to evaluate electrolytes and kidney function 3. Please also check CBC to evaluate Hg and Hct levels  Discharge Diagnoses:  Principal Problem:   Allergic reaction Active Problems:   Yellow jacket sting   Hypotension   Tachycardia  Discharge Condition: Stable  Diet recommendation: Heart healthy diet discussed in details   History of present illness:  44 y.o. male with the past medical history of sciatica who underwent a vasectomy this past Thursday, which was uneventful. He was working in his lawn when he was stung by yellow jackets in the left ankle area. About 20-30 minutes after this episode, he started having itching. He felt bumps on his skin. He took Benadryl. Symptoms did not improve. He used his son's EpiPen injection.   Hospital Course:  #1 Severe Allergic reaction after yellow jacket sting: He was given steroids in ED and upon arrival to the floor. Reaction now resolved, no swelling, stopped solumedrol and d/c home on keflex to complete the therapy and also continue benadryl as needed.   #2 recent vasectomy: No obvious complication of this procedure noted on examination.  #3 sciatica: Patient states that this has been acting up for the past couple of weeks. He should follow-up with his PCP for the same.  Procedures/Studies: Dg Chest Port 1 View  06/27/2015 No active disease.   Consultations:  None   Antibiotics:  None   Discharge Exam: Filed Vitals:   06/28/15 0438  BP: 103/57  Pulse: 91  Temp: 98.4 F (36.9 C)  Resp: 18   Filed Vitals:   06/27/15 1551 06/27/15 1721 06/27/15 2055 06/28/15 0438  BP: 94/62 106/75 114/63 103/57  Pulse: 118 117 102 91  Temp:  99.6 F  (37.6 C) 98.8 F (37.1 C) 98.4 F (36.9 C)  TempSrc:  Oral Oral Oral  Resp:  Height:   (1.905 m)    Weight:  116.9 kg (257 lb 11.5 oz) 119.432 kg (263 lb 4.8 oz)   SpO2: 97% 98% 97% 97%    General: Pt is alert, follows commands appropriately, not in acute distress Cardiovascular: Regular rate and rhythm, S1/S2 +, no murmurs, no rubs, no gallops Respiratory: Clear to auscultation bilaterally, no wheezing, no crackles, no rhonchi Abdominal: Soft, non tender, non distended, bowel sounds +, no guarding Extremities: no edema, no cyanosis, pulses palpable bilaterally DP and PT Neuro: Grossly nonfocal  Discharge Instructions  Discharge Instructions    Diet - low sodium heart healthy    Complete by:  As directed      Increase activity slowly    Complete by:  As directed             Medication List    STOP taking these medications        HYDROcodone-acetaminophen 5-325 MG per tablet  Commonly known as:  NORCO/VICODIN     naproxen 250 MG tablet  Commonly known as:  NAPROSYN      TAKE these medications        cephALEXin 500 MG capsule  Commonly known as:  KEFLEX  Take 1 capsule (500 mg total) by mouth 3 (three) times daily.     diphenhydrAMINE 25 mg capsule  Commonly known as:  BENADRYL  Take 1 capsule (25 mg total) by mouth every 6 (six) hours as needed for itching.     EPINEPHrine 0.3 mg/0.3 mL Soaj injection  Commonly known as:  EPI-PEN  Inject 0.3 mLs (0.3 mg total) into the muscle once.     multivitamin tablet  Take 1 tablet by mouth daily.     oxyCODONE 5 MG immediate release tablet  Commonly known as:  Oxy IR/ROXICODONE  Take 1 tablet (5 mg total) by mouth every 4 (four) hours as needed for moderate pain.     valACYclovir 1000 MG tablet  Commonly known as:  VALTREX  Take 1 g by mouth daily as needed (for outbreaks).            Follow-up Information    Follow up with Lupe Carneyean Mitchell, MD. Schedule an appointment as soon as possible for a  visit in 1 week.   Specialty:  Family Medicine   Why:  discuss referral to allergist for venom immune therapy   Contact information:   [address] [place] [address] [phone]       Call Debbora PrestoMAGICK-Lashaundra Lehrmann, MD.   Specialty:  Internal Medicine   Why:  As needed call my cell phone [phone]   Contact information:   [address] [place] [address] [phone]        The results of significant diagnostics from this hospitalization (including imaging, microbiology, ancillary and laboratory) are listed below for reference.     Microbiology: No results found for this or any previous visit (from the past 240 hour(s)).   Labs: Basic Metabolic Panel:  Recent Labs Lab 06/27/15 1155 06/28/15 0509  NA 139 135  K 4.1 4.2  CL 101 104  CO2 25 24  GLUCOSE 134* 142*  BUN 17 13  CREATININE 1.48* 1.18  CALCIUM 9.5 8.2*   Liver Function Tests:  Recent Labs Lab 06/27/15 1155  AST 31  ALT 27  ALKPHOS 63  BILITOT 0.3  PROT 7.9  ALBUMIN 3.8    Recent Labs Lab 06/27/15 1155  LIPASE 21*   CBC:  Recent Labs Lab 06/27/15 1155 06/28/15 0509  WBC 9.2 16.6*  NEUTROABS 4.0  --   HGB 15.3 13.1  HCT 45.3 38.8*  MCV 85.8 85.8  PLT 364 328   Cardiac Enzymes:  Recent Labs Lab 06/27/15 1155  TROPONINI <0.03    SIGNED: Time coordinating discharge:  30 minutes  Debbora PrestoMAGICK-Saburo Luger, MD  [facility] 06/28/2015, 9:34 AM Pager [phone]  If 7PM-7AM, please contact night-coverage [email or web address] Password [email or web address]

## 2015-06-28 NOTE — Discharge Instructions (Signed)
Allergies °Allergies may happen from anything your body is sensitive to. This may be food, medicines, pollens, chemicals, and nearly anything around you in everyday life that produces allergens. An allergen is anything that causes an allergy producing substance. Heredity is often a factor in causing these problems. This means you may have some of the same allergies as your parents. °Food allergies happen in all age groups. Food allergies are some of the most severe and life threatening. Some common food allergies are cow's milk, seafood, eggs, nuts, wheat, and soybeans. °SYMPTOMS  °· Swelling around the mouth. °· An itchy red rash or hives. °· Vomiting or diarrhea. °· Difficulty breathing. °SEVERE ALLERGIC REACTIONS ARE LIFE-THREATENING. °This reaction is called anaphylaxis. It can cause the mouth and throat to swell and cause difficulty with breathing and swallowing. In severe reactions only a trace amount of food (for example, peanut oil in a salad) may cause death within seconds. °Seasonal allergies occur in all age groups. These are seasonal because they usually occur during the same season every year. They may be a reaction to molds, grass pollens, or tree pollens. Other causes of problems are house dust mite allergens, pet dander, and mold spores. The symptoms often consist of nasal congestion, a runny itchy nose associated with sneezing, and tearing itchy eyes. There is often an associated itching of the mouth and ears. The problems happen when you come in contact with pollens and other allergens. Allergens are the particles in the air that the body reacts to with an allergic reaction. This causes you to release allergic antibodies. Through a chain of events, these eventually cause you to release histamine into the blood stream. Although it is meant to be protective to the body, it is this release that causes your discomfort. This is why you were given anti-histamines to feel better.  If you are unable to  pinpoint the offending allergen, it may be determined by skin or blood testing. Allergies cannot be cured but can be controlled with medicine. °Hay fever is a collection of all or some of the seasonal allergy problems. It may often be treated with simple over-the-counter medicine such as diphenhydramine. Take medicine as directed. Do not drink alcohol or drive while taking this medicine. Check with your caregiver or package insert for child dosages. °If these medicines are not effective, there are many new medicines your caregiver can prescribe. Stronger medicine such as nasal spray, eye drops, and corticosteroids may be used if the first things you try do not work well. Other treatments such as immunotherapy or desensitizing injections can be used if all else fails. Follow up with your caregiver if problems continue. These seasonal allergies are usually not life threatening. They are generally more of a nuisance that can often be handled using medicine. °HOME CARE INSTRUCTIONS  °· If unsure what causes a reaction, keep a diary of foods eaten and symptoms that follow. Avoid foods that cause reactions. °· If hives or rash are present: °¨ Take medicine as directed. °¨ You may use an over-the-counter antihistamine (diphenhydramine) for hives and itching as needed. °¨ Apply cold compresses (cloths) to the skin or take baths in cool water. Avoid hot baths or showers. Heat will make a rash and itching worse. °· If you are severely allergic: °¨ Following a treatment for a severe reaction, hospitalization is often required for closer follow-up. °¨ Wear a medic-alert bracelet or necklace stating the allergy. °¨ You and your family must learn how to give adrenaline or use   an anaphylaxis kit.  If you have had a severe reaction, always carry your anaphylaxis kit or EpiPen with you. Use this medicine as directed by your caregiver if a severe reaction is occurring. Failure to do so could have a fatal outcome. SEEK MEDICAL  CARE IF:  You suspect a food allergy. Symptoms generally happen within 30 minutes of eating a food.  Your symptoms have not gone away within 2 days or are getting worse.  You develop new symptoms.  You want to retest yourself or your child with a food or drink you think causes an allergic reaction. Never do this if an anaphylactic reaction to that food or drink has happened before. Only do this under the care of a caregiver. SEEK IMMEDIATE MEDICAL CARE IF:   You have difficulty breathing, are wheezing, or have a tight feeling in your chest or throat.  You have a swollen mouth, or you have hives, swelling, or itching all over your body.  You have had a severe reaction that has responded to your anaphylaxis kit or an EpiPen. These reactions may return when the medicine has worn off. These reactions should be considered life threatening. MAKE SURE YOU:   Understand these instructions.  Will watch your condition.  Will get help right away if you are not doing well or get worse. Document Released: 02/21/2003 Document Revised: 03/25/2013 Document Reviewed: 07/28/2008 [facility] Patient Information 2015 [place], [place]. This information is not intended to replace advice given to you by your health care provider. Make sure you discuss any questions you have with your health care provider.

## 2015-06-28 NOTE — Care Management (Signed)
CM met with patient and wife at bedside no CM needs identified.

## 2016-08-04 IMAGING — DX DG CHEST 1V PORT
1 series · 1 of 1 positions shown · non-contrast
Comparison: None.

CLINICAL DATA: Tongue swelling, stung by Ismet Isko in left ankle

EXAM:
PORTABLE CHEST - 1 VIEW

[chest ap]
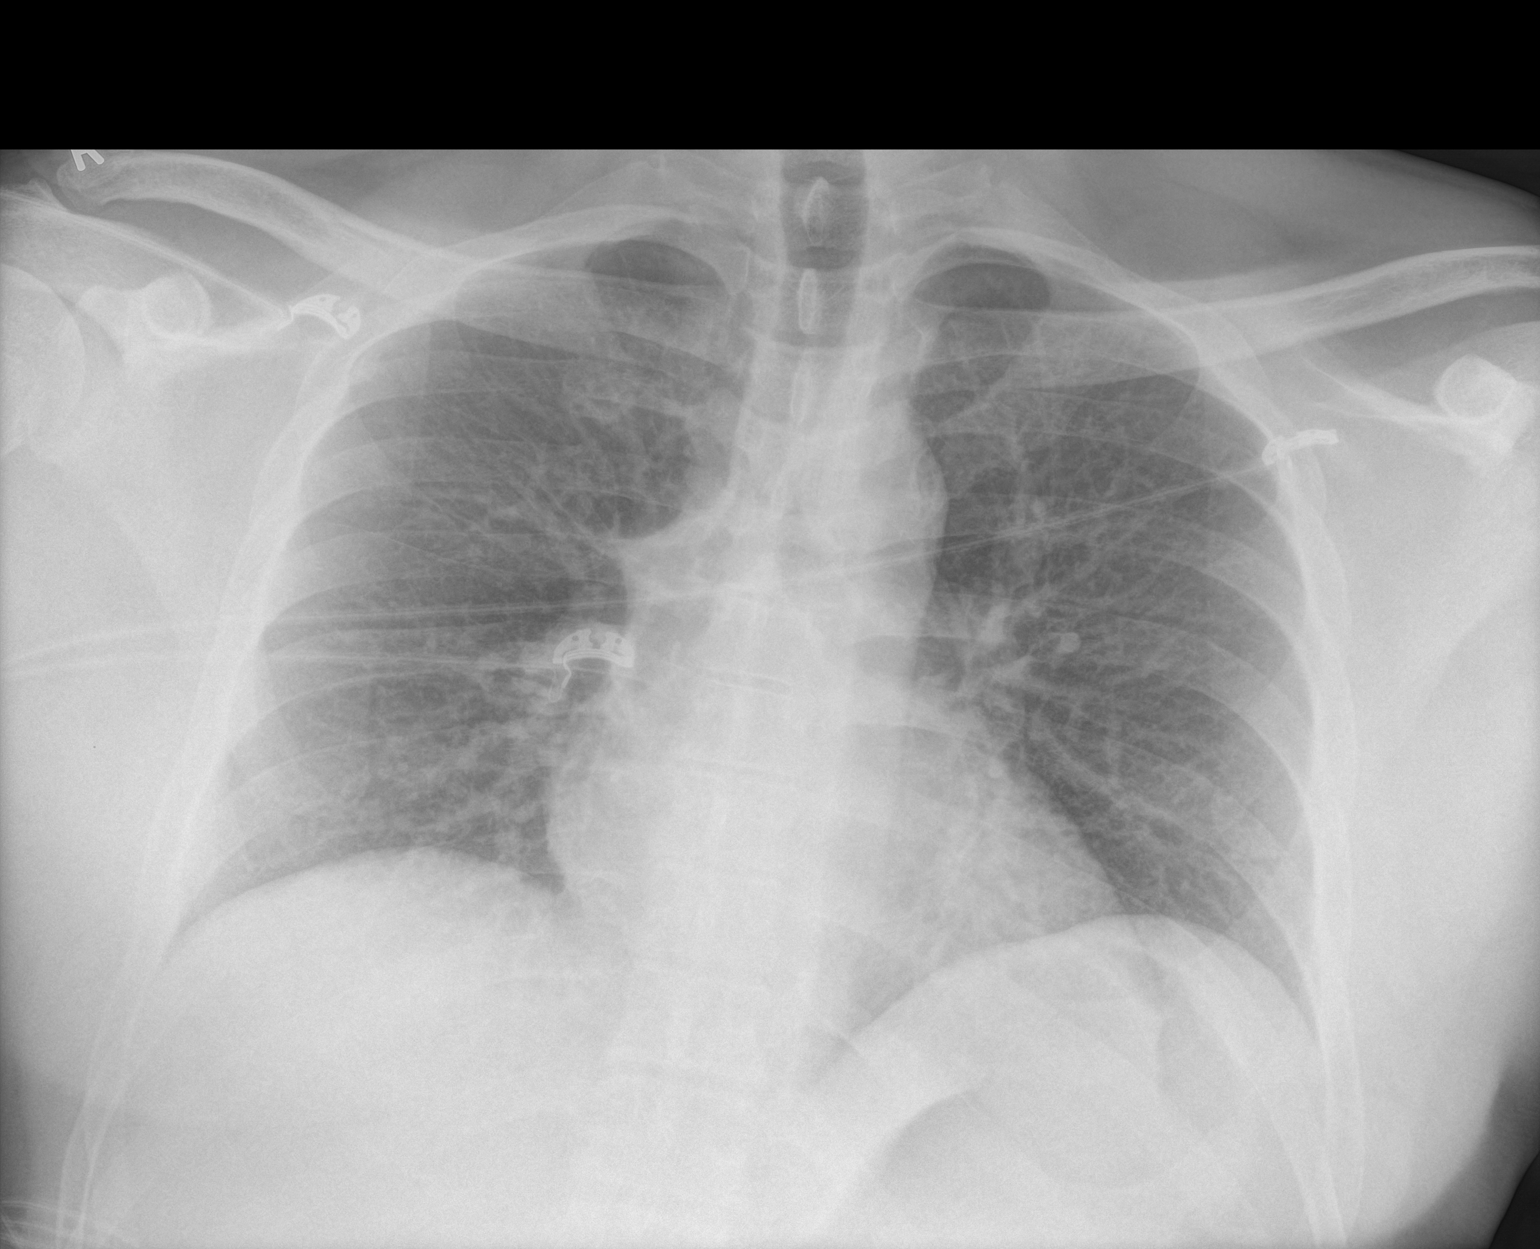

[1 of 1 positions shown; findings below may reference images not displayed]

FINDINGS: The heart size and mediastinal contours are within normal limits.
Both lungs are clear. The visualized skeletal structures are
unremarkable.
IMPRESSION: No active disease.

## 2017-05-01 DIAGNOSIS — M4316 Spondylolisthesis, lumbar region: Secondary | ICD-10-CM | POA: Diagnosis not present

## 2017-05-01 DIAGNOSIS — M5431 Sciatica, right side: Secondary | ICD-10-CM | POA: Diagnosis not present

## 2017-05-01 DIAGNOSIS — M5116 Intervertebral disc disorders with radiculopathy, lumbar region: Secondary | ICD-10-CM | POA: Diagnosis not present

## 2017-05-01 DIAGNOSIS — M4726 Other spondylosis with radiculopathy, lumbar region: Secondary | ICD-10-CM | POA: Diagnosis not present

## 2017-12-21 DIAGNOSIS — J069 Acute upper respiratory infection, unspecified: Secondary | ICD-10-CM | POA: Diagnosis not present

## 2017-12-24 DIAGNOSIS — R899 Unspecified abnormal finding in specimens from other organs, systems and tissues: Secondary | ICD-10-CM | POA: Diagnosis not present

## 2017-12-24 DIAGNOSIS — J069 Acute upper respiratory infection, unspecified: Secondary | ICD-10-CM | POA: Diagnosis not present

## 2017-12-25 DIAGNOSIS — J209 Acute bronchitis, unspecified: Secondary | ICD-10-CM | POA: Diagnosis not present

## 2018-02-13 DIAGNOSIS — Z0000 Encounter for general adult medical examination without abnormal findings: Secondary | ICD-10-CM | POA: Diagnosis not present

## 2018-05-11 DIAGNOSIS — F329 Major depressive disorder, single episode, unspecified: Secondary | ICD-10-CM | POA: Diagnosis not present

## 2018-07-11 ENCOUNTER — Encounter (HOSPITAL_BASED_OUTPATIENT_CLINIC_OR_DEPARTMENT_OTHER): Payer: Self-pay

## 2018-07-11 ENCOUNTER — Other Ambulatory Visit: Payer: Self-pay

## 2018-07-11 ENCOUNTER — Emergency Department (HOSPITAL_BASED_OUTPATIENT_CLINIC_OR_DEPARTMENT_OTHER)
Admission: EM | Admit: 2018-07-11 | Discharge: 2018-07-11 | Disposition: A | Discharge status: Home / Self Care | Payer: 59 | Attending: Emergency Medicine | Admitting: Emergency Medicine

## 2018-07-11 DIAGNOSIS — I959 Hypotension, unspecified: Secondary | ICD-10-CM | POA: Diagnosis not present

## 2018-07-11 DIAGNOSIS — T63441A Toxic effect of venom of bees, accidental (unintentional), initial encounter: Secondary | ICD-10-CM | POA: Insufficient documentation

## 2018-07-11 DIAGNOSIS — Z79899 Other long term (current) drug therapy: Secondary | ICD-10-CM | POA: Insufficient documentation

## 2018-07-11 DIAGNOSIS — T7840XA Allergy, unspecified, initial encounter: Secondary | ICD-10-CM

## 2018-07-11 MED ORDER — FAMOTIDINE IN NACL 20-0.9 MG/50ML-% IV SOLN
20.0000 mg | Freq: Once | INTRAVENOUS | Status: AC
  Administered 2018-07-11: 20 mg via INTRAVENOUS
  Filled 2018-07-11: qty 50

## 2018-07-11 MED ORDER — SODIUM CHLORIDE 0.9 % IV SOLN
INTRAVENOUS | Status: DC | PRN
Start: 2018-07-11 — End: 2018-07-12
  Administered 2018-07-11: 500 mL via INTRAVENOUS

## 2018-07-11 MED ORDER — PREDNISONE 50 MG PO TABS: 50.0000 mg | ORAL_TABLET | Freq: Every day | ORAL | 0 refills | Status: AC

## 2018-07-11 MED ORDER — METHYLPREDNISOLONE SODIUM SUCC 125 MG IJ SOLR
125.0000 mg | Freq: Once | INTRAMUSCULAR | Status: AC
  Administered 2018-07-11: 125 mg via INTRAVENOUS
  Filled 2018-07-11: qty 2

## 2018-07-11 MED ORDER — DIPHENHYDRAMINE HCL 25 MG PO TABS: 25.0000 mg | ORAL_TABLET | Freq: Four times a day (QID) | ORAL | 0 refills | Status: AC

## 2018-07-11 MED ORDER — FAMOTIDINE 20 MG PO TABS: 20.0000 mg | ORAL_TABLET | Freq: Two times a day (BID) | ORAL | 0 refills | Status: AC

## 2018-07-11 MED ORDER — EPINEPHRINE 0.3 MG/0.3ML IJ SOAJ: 0.3000 mg | Freq: Once | INTRAMUSCULAR | 1 refills | Status: AC

## 2018-07-11 NOTE — ED Provider Notes (Signed)
[facility] EMERGENCY DEPARTMENT Provider Note   CSN: [id number] Arrival date & time: 07/11/18  1946     History   Chief Complaint Chief Complaint  Patient presents with  . Insect Bite    HPI Stephen Greene is a 47 y.o. male.  HPI Stephen Greene is a 47 y.o. male presents to ED with complaint of bee sting. Pt was mowing lawn when he felt something sting him on the left fank.  He had anaphylactic reaction a year ago after an insect sting and had to be admitted due to hypotension.  Patient states that his EpiPen was expired, heat to 50 mill grams of Benadryl and came straight here.  He currently reports fullness in his throat, however denies any obvious swelling to his throat, tongue, lips.  According to his family, patient's face is swollen.  No hives.  Reports diffuse itching.  No difficulty breathing.  Past Medical History:  Diagnosis Date  . Sciatic pain   . Sciatica of right side     Patient Active Problem List   Diagnosis Date Noted  . Allergic reaction 06/27/2015  . Yellow jacket sting 06/27/2015  . Hypotension 06/27/2015  . Tachycardia 06/27/2015    Past Surgical History:  Procedure Laterality Date  . VASECTOMY  06/25/2015        Home Medications    Prior to Admission medications   Medication Sig Start Date End Date Taking? Authorizing Provider  diphenhydrAMINE (BENADRYL) 25 mg capsule Take 1 capsule (25 mg total) by mouth every 6 (six) hours as needed for itching. 06/28/15   Dorothea Ogle, MD  EPINEPHrine 0.3 mg/0.3 mL IJ SOAJ injection Inject 0.3 mLs (0.3 mg total) into the muscle once. 06/28/15   Dorothea Ogle, MD  Multiple Vitamin (MULTIVITAMIN) tablet Take 1 tablet by mouth daily.    [provider]  oxyCODONE (OXY IR/ROXICODONE) 5 MG immediate release tablet Take 1 tablet (5 mg total) by mouth every 4 (four) hours as needed for moderate pain. 06/28/15   Dorothea Ogle, MD  valACYclovir (VALTREX) 1000 MG tablet Take 1 g by mouth daily as  needed (for outbreaks).  05/20/15   [provider]    Family History Family History  Problem Relation Age of Onset  . Heart attack Mother   . Allergy (severe) Son     Social History Social History   Tobacco Use  . Smoking status: Never Smoker  . Smokeless tobacco: Never Used  Substance Use Topics  . Alcohol use: Yes    Alcohol/week: 0.6 oz    Types: 1 Cans of beer per week    Comment: occ  . Drug use: No     Allergies   Bee venom   Review of Systems Review of Systems  Constitutional: Negative for chills and fever.  HENT: Positive for facial swelling. Negative for sore throat, trouble swallowing and voice change.   Respiratory: Negative for cough, chest tightness and shortness of breath.   Cardiovascular: Negative for chest pain, palpitations and leg swelling.  Gastrointestinal: Negative for abdominal distention, abdominal pain, diarrhea, nausea and vomiting.  Genitourinary: Negative for dysuria, frequency, hematuria and urgency.  Musculoskeletal: Negative for arthralgias, myalgias, neck pain and neck stiffness.  Skin: Negative for rash.  Allergic/Immunologic: Negative for immunocompromised state.  Neurological: Negative for dizziness, weakness, light-headedness, numbness and headaches.  All other systems reviewed and are negative.    Physical Exam Updated Vital Signs BP (!) 125/97 (BP Location: Left Arm)  Pulse 87   Temp 98.1 F (36.7 C) (Oral)   Resp 16   Ht 6\' 3"  (1.905 m)   Wt 105.7 kg (233 lb)   SpO2 99%   BMI 29.12 kg/m   Physical Exam  Constitutional: He appears well-developed and well-nourished. No distress.  HENT:  Head: Normocephalic and atraumatic.  Mild erythema and swelling over the face diffusely.  Lips, tongue, oropharynx normal.  Eyes: Conjunctivae are normal.  Neck: Neck supple.  Cardiovascular: Normal rate, regular rhythm and normal heart sounds.  Pulmonary/Chest: Effort normal. No respiratory distress. He has no wheezes. He  has no rales.  No stridor.  Abdominal: Soft. Bowel sounds are normal. He exhibits no distension. There is no tenderness. There is no rebound.  Musculoskeletal: He exhibits no edema.  Neurological: He is alert.  Skin: Skin is warm and dry.  No rash  Nursing note and vitals reviewed.    ED Treatments / Results  Labs (all labs ordered are listed, but only abnormal results are displayed) Labs Reviewed - No data to display  EKG None  Radiology No results found.  Procedures Procedures (including critical care time)  Medications Ordered in ED Medications  methylPREDNISolone sodium succinate (SOLU-MEDROL) 125 mg/2 mL injection 125 mg (has no administration in time range)  famotidine (PEPCID) IVPB 20 mg premix (has no administration in time range)     Initial Impression / Assessment and Plan / ED Course  I have reviewed the triage vital signs and the nursing notes.  Pertinent labs & imaging results that were available during my care of the patient were reviewed by me and considered in my medical decision making (see chart for details).     8:30 PM Pt seen and examined pt with allergic reaction after a bee sting. Took 50mg  of benadryl. Prior anaphylaxis a year ago from a sting with hypotension requiring hospitalization. Pt in nad a this time. No stridor. No hives. Face swollen according to family. Will start iv, give solumedrol and pepcid and watch closely.   Patient monitored for 3 hours. Feels much better. No throat swelling or discomfort. Facial swelling went down.  Vital signs are normal.  Patient stable for discharge home.  We will send him home with prednisone, Benadryl, Pepcid.  I will refill his EpiPen.  Advised to follow-up with his family doctor return if worsening symptoms.  Patient agreed.  Vitals:   07/11/18 1956 07/11/18 1957 07/11/18 2012 07/11/18 2319  BP:  (!) 125/97  134/86  Pulse:  86 87 81  Resp:  16  16  Temp:  98.1 F (36.7 C)  98.2 F (36.8 C)    TempSrc:  Oral  Oral  SpO2:  100% 99% 98%  Weight: 105.7 kg (233 lb)     Height: 6\' 3"  (1.905 m)        Final Clinical Impressions(s) / ED Diagnoses   Final diagnoses:  Allergic reaction, initial encounter  Bee sting, accidental or unintentional, initial encounter    ED Discharge Orders    None       Jaynie CrumbleKirichenko, Dior Dominik, PA-C 07/12/18 0022    Terrilee FilesButler, Michael C, MD 07/12/18 1725

## 2018-07-11 NOTE — ED Notes (Signed)
Facial swelling noted (between his eyes).

## 2018-07-11 NOTE — ED Triage Notes (Addendum)
Pt c/o wasp sting to right lower back approx 1 hour PTA-c/o "trouble breathing and face swelling"-benadryl x 2 caps-NAD-steady gait-states he has required hosp admit for bee stings in the past-does not carry epi pen

## 2018-07-11 NOTE — Discharge Instructions (Addendum)
Continue benadryl throughout the day tomorrow.  Take Pepcid as prescribed for the next 2 days as well.  Take prednisone as prescribed until all gone.  Keep an EpiPen on you and if you feel like your throat, tongue, lips or swelling, or having shortness of breath after an allergic reaction, use EpiPen and call 911.  Return if worsening symptoms.  Otherwise follow-up with your doctor.
